# Patient Record
Sex: Female | Born: 1970 | Race: White | Hispanic: No | Marital: Married
Health system: Southern US, Community
[De-identification: ages and names within clinical notes are randomized; demographics above are authoritative.]

## PROBLEM LIST (undated history)

## (undated) HISTORY — PX: AUGMENTATION MAMMAPLASTY: SUR837

## (undated) HISTORY — PX: BUNIONECTOMY: SHX129

---

## 2007-10-01 ENCOUNTER — Inpatient Hospital Stay (HOSPITAL_COMMUNITY): Admission: AD | Admit: 2007-10-01 | Discharge: 2007-10-02 | Payer: Self-pay | Admitting: Obstetrics and Gynecology

## 2007-10-02 ENCOUNTER — Inpatient Hospital Stay (HOSPITAL_COMMUNITY): Admission: AD | Admit: 2007-10-02 | Discharge: 2007-10-02 | Payer: Self-pay | Admitting: Obstetrics and Gynecology

## 2007-10-03 ENCOUNTER — Inpatient Hospital Stay (HOSPITAL_COMMUNITY): Admission: AD | Admit: 2007-10-03 | Discharge: 2007-11-06 | Payer: Self-pay | Admitting: Obstetrics and Gynecology

## 2008-01-01 ENCOUNTER — Ambulatory Visit: Admission: RE | Admit: 2008-01-01 | Discharge: 2008-01-01 | Payer: Self-pay | Admitting: Obstetrics and Gynecology

## 2010-02-21 ENCOUNTER — Inpatient Hospital Stay (HOSPITAL_COMMUNITY)
Admission: AD | Admit: 2010-02-21 | Discharge: 2010-02-23 | Payer: Self-pay | Source: Home / Self Care | Attending: Obstetrics and Gynecology | Admitting: Obstetrics and Gynecology

## 2010-02-21 ENCOUNTER — Encounter (INDEPENDENT_AMBULATORY_CARE_PROVIDER_SITE_OTHER): Payer: Self-pay | Admitting: Obstetrics and Gynecology

## 2010-03-03 ENCOUNTER — Inpatient Hospital Stay: Admission: RE | Admit: 2010-03-03 | Payer: Self-pay | Source: Home / Self Care | Admitting: Obstetrics and Gynecology

## 2010-05-08 LAB — CBC
MCH: 33.2 pg (ref 26.0–34.0)
MCH: 33.4 pg (ref 26.0–34.0)
MCHC: 35.1 g/dL (ref 30.0–36.0)
MCHC: 35.1 g/dL (ref 30.0–36.0)
MCV: 94.6 fL (ref 78.0–100.0)
MCV: 95.1 fL (ref 78.0–100.0)
Platelets: 156 10*3/uL (ref 150–400)
Platelets: 159 10*3/uL (ref 150–400)
RDW: 12.9 % (ref 11.5–15.5)
RDW: 13.1 % (ref 11.5–15.5)
WBC: 8.6 10*3/uL (ref 4.0–10.5)

## 2010-07-11 NOTE — Op Note (Signed)
NAMELUVERTA, KORTE                  ACCOUNT NO.:  [id number]   MEDICAL RECORD NO.:  [id number]          PATIENT TYPE:  INP   LOCATION:  NA                            FACILITY:  [facility]   PHYSICIAN:  Michelle L. Grewal, M.D.DATE OF BIRTH:  Mar 09, 1970   DATE OF PROCEDURE:  11/03/2007  DATE OF DISCHARGE:                               OPERATIVE REPORT   PREOPERATIVE DIAGNOSES:  1. Intrauterine pregnancy at 37 weeks.  2. Double footling breech with funic presentation.   POSTOPERATIVE DIAGNOSES:  1. Intrauterine pregnancy at 37 weeks.  2. Double footling breech with funic presentation.   PROCEDURE:  Primary low-transverse cesarean section.   SURGEON:  Michelle L. Grewal, MD   ANESTHESIA:  Spinal.   FINDINGS:  Female infant in double footling breech position with  significant cord presenting at the cervical os.  Apgars 9 at 1 minute  and 9 at 5 minutes.   ESTIMATED BLOOD LOSS:  500 mL.   DRAINS:  Foley.   COMPLICATIONS:  None.   PROCEDURE:  The patient was taken to the operating room from antenatal.  A spinal was placed and she was prepped and draped in the usual sterile  fashion.  A Foley catheter was inserted.  A low-transverse incision was  made and carried down to the fascia.  Fascia was scored in the midline  and extended laterally.  The rectus muscles were separated in the  midline.  The peritoneum was entered bluntly.  A bladder blade was  inserted.  The lower uterine segment was identified.  The bladder flap  was created sharply and then digitally.  A bladder blade was readjusted.  A low-transverse incision was made in the uterus.  The uterus was  entered using a hemostat.  Amniotic fluid was clear.  The baby was in  double footling breech position and there was significant amount of cord  presenting at the cervical os.  It was wrapped around the feet.  The  baby was delivered without any difficulty at all.  Baby was a female  infant, Apgars 9 at 1 minute and 9 at 5  minutes.  The cord was also  noted to be in itself short.  After the baby was handed to the awaiting  neonatal team, the placenta was manually removed and noted to be normal  and intact with a 3-vessel cord.  The uterus was exteriorized and  cleared of all clots and debris.  The antibiotics and Pitocin had been  given.  The uterine incision was closed in 2 layers using 0 chromic in a  running locked stitch.  The uterus was returned to the abdomen.  Irrigation was performed.  The rectus muscles were reapproximated using  0 Vicryl as well as the peritoneum.  The fascia was closed using 0  Vicryl in a running locked stitch.  After irrigation of subcutaneous  layer  and noting hemostasis, the skin was closed in subcuticular using 3-0  Vicryl in a running stitch.  Dermabond skin adhesive was placed on the  incision.  All sponge, lap, and instrument counts were correct  x2.  The  patient went to recovery room in stable condition.      Michelle L. Vincente Poli, M.D.  Electronically Signed     MLG/MEDQ  D:  11/03/2007  T:  11/03/2007  Job:  [id number]

## 2010-07-11 NOTE — Discharge Summary (Signed)
Joanna Fuller, Joanna Fuller                  ACCOUNT NO.:  [id number]   MEDICAL RECORD NO.:  [id number]          PATIENT TYPE:  MAT   LOCATION:  MATC                          FACILITY:  [facility]   PHYSICIAN:  Juluis Mire, M.D.   DATE OF BIRTH:  21-Feb-1971   DATE OF ADMISSION:  10/02/2007  DATE OF DISCHARGE:  10/02/2007                               DISCHARGE SUMMARY   ADMITTING DIAGNOSES:  Intrauterine pregnancy at 32-4/7 with preterm  cervical change and preterm labor.   DISCHARGE DIAGNOSES:  Intrauterine pregnancy at 32-4/7 with preterm  cervical change and preterm labor.   For complete history and physical, please see written note.   COURSE IN THE HOSPITAL:  The patient was brought in, placed on  Procardia, and continuous monitoring.  With Procardia, uterine activity  resolved.  Fetal heart rate remained reactive.  No decelerations.  Following morning, the cervix was basically unchanged, closed in 50%.  She had received one betamethasone shot and was due a second one on the  evening of October 02, 2007.  She, therefore, will be sent home at this  point in time.   In terms of complications, none were encountered during her stay in the  hospital.  The patient was discharged home in stable condition.   DISPOSITION:  The patient will continue Procardia at home 10 mg every 4  hours and rest.  Preterm labor warning signs were given.  Follow up in  the office tomorrow.  She will come in this afternoon to [facility] to receive  her next betamethasone shot.      Juluis Mire, M.D.  Electronically Signed     JSM/MEDQ  D:  10/02/2007  T:  10/02/2007  Job:  [id number]

## 2010-07-11 NOTE — H&P (Signed)
NAMEPETRONELLA, Joanna Fuller                  ACCOUNT NO.:  [id number]   MEDICAL RECORD NO.:  [id number]          PATIENT TYPE:  INP   LOCATION:                                [facility]ACILITY:  [facility]   PHYSICIAN:  Duke Salvia. Marcelle Overlie, M.D.DATE OF BIRTH:  [birth year]   DATE OF ADMISSION:  10/03/2007  DATE OF DISCHARGE:                              HISTORY & PHYSICAL   CHIEF COMPLAINT:  Premature labor.   HISTORY OF PRESENT ILLNESS:  A 40 year old, G1, P0 at 32-5/7 weeks.  One  week ago was hospitalized for evaluation of a short cervix, may receive  IM betamethasone and was discharged on p.o. Procardia.  Two days ago in  our office, cervical length was 5 x 13 mm, noted to be dynamic.  She was  brought back today for follow-up showing cervical length 9 mm with  funneling seen, also a cord was presenting breech presentation.  Clinically, the cervix was closed.  FFN dated August 6 was negative.  Due to these changes, she is admitted now for bedrest and observation.   One-hour GTT was 100.  Prenatal course has been otherwise uneventful.   PAST MEDICAL HISTORY:  Please see the [facility] form for details.   PHYSICAL EXAMINATION:  Temperature 98.2, blood pressure 120/70.  HEENT:  Unremarkable.  NECK:  Supple without masses.  LUNGS:  Clear.  CARDIOVASCULAR:  Rate and rhythm without murmurs, rubs, gallops.  BREASTS:  Not examined.  Fetal heart rate was 140, 30 cm fundal height.  Cervix was closed and  thin.  EXTREMITIES/NEUROLOGIC EXAM:  Unremarkable.   IMPRESSION:  1. A 32-5/[redacted] week gestation.  2. Breech presentation.  3. Preterm labor with cervix 9 mm, funneling noted with cord      presenting.   PLAN:  Will admit for bedrest, observation.  Continue Procardia and  follow-up ultrasound.      Richard M. Marcelle Overlie, M.D.  Electronically Signed     RMH/MEDQ  D:  10/03/2007  T:  10/03/2007  Job:  [id number]

## 2010-07-14 NOTE — Discharge Summary (Signed)
Joanna Fuller, Joanna Fuller                  ACCOUNT NO.:  [id number]   MEDICAL RECORD NO.:  [id number]          PATIENT TYPE:  INP   LOCATION:  9121                          FACILITY:  [facility]   PHYSICIAN:  Freddy Finner, M.D.   DATE OF BIRTH:  1970-12-17   DATE OF ADMISSION:  10/03/2007  DATE OF DISCHARGE:  11/06/2007                               DISCHARGE SUMMARY   ADMITTING DIAGNOSES:  1. Intrauterine pregnancy at 32-5/7 weeks' estimated gestational age.  2. Breech presentation.  3. Preterm labor.   DISCHARGE DIAGNOSES:  1. Status post low transverse cesarean section secondary to breech,      funic presentation.  2. Viable female infant.   PROCEDURE:  Primary low transverse cesarean section.   REASON FOR ADMISSION:  Please see dictated H and P.   HOSPITAL COURSE:  The patient is a 40 year old primigravida that was  admitted to [facility] at 32-5/7 weeks' estimated  gestational age for preterm labor with breech presentation with cord  presenting.  The patient had been hospitalized approximately a week  prior to her current admission with noted shortened cervix.  The patient  had received IM betamethasone at that time and was discharged home on  Procardia.  The patient did present to the office and cervical length  was now noted to be 5 x 13 mm and noted to be somewhat dynamic.  Because  of the cord presenting with a breech presentation and cervix was found  to be shortened, a decision was made to hospitalize the patient for  bedrest and observation.  She was continued on Procardia.  On admission,  vital signs were stable.  Fetal heart tones were in the 140s.  The  patient was placed on the monitor and rare contraction was seen.  Vital  signs were stable.  The patient was observed over the next several days,  continued to do well on bedrest and thought was that we would perform  amniocentesis at approximately 35 weeks for fetal lung maturity.  The  patient did do well  and at approximately 35 weeks, amniocentesis was  performed without difficulty for LS and PG.  The patient tolerated the  procedure well; however, the amniocentesis came back immature.  The  patient continued on bedrest and vital signs remained stable.  Fetal  heart tones were reactive and decision was made to deliver the patient  at approximately 37 weeks.  On the morning of admission, the patient was  noted to have some elevation in blood pressure.  She was transferred to  the operating room where a spinal anesthesia was administered without  difficulty.  A low transverse incision was made with delivery of a  viable female infant weighing 5 pounds 4 ounces with Apgars of 9 at 1  minute and 9 at 5 minutes.  The patient tolerated the procedure well and  was taken to the recovery room in stable condition.  On postoperative  day #1, the patient denied headache or visual changes.  Blood pressure  was 122/91.  Lungs were clear to auscultation.  Abdomen, soft  with good  return of bowel function.  Incision was clean, dry and intact.  Laboratory findings showed hemoglobin of 12.7, platelet count of  131,000, WBC count of 9.7, SGOT was 52 and SGPT was 28.  The patient  continued to be observed closely.  On postoperative day #2, the patient  denied headache or right upper quadrant pain.  She did complain of some  blurred vision associated with reading.  Vital signs were stable.  She  was afebrile.  Blood pressure 121/88 to 107/72.  Abdomen soft, no right  upper quadrant tenderness was observed.  Fundus was firm, nontender.  Incision was clean, dry and intact with subcuticular closure.  Laboratory findings revealed hemoglobin of 12.9, platelet count up to  155,000, WBC count of 9.5, AST was down to 44, ALT down to 27, alkaline  phosphatase was down to 150.  Uric acid was 4.5.  On postoperative day  #3, the patient was without complaint.  She denied headache, blurred  vision or right upper quadrant  pain.  Vital signs were stable.  Fundus  firm and nontender.  Incision was clean, dry and intact.  Discharge  instructions were reviewed and the patient was later discharged home.   CONDITION ON DISCHARGE:  Stable.   DIET:  Regular as tolerated.   ACTIVITY:  No heavy lifting.  No driving x2 weeks.  No vaginal entry.   FOLLOW UP:  The patient to follow up in the office in 1 week for an  incision check.  She is to call for temperature greater than 100  degrees, persistent nausea, vomiting, heavy vaginal bleeding and/or  redness or drainage from the incisional site.  The patient was also  instructed call for headache, blurred vision or right upper quadrant  pain   DISCHARGE MEDICATIONS:  1. Percocet 5/325 #30 one p.o. every 4-6 hours p.r.n.  2. Motrin 600 mg every 6 hours.  3. Prenatal vitamins 1 p.o. daily.      Julio Sicks, N.P.      Freddy Finner, M.D.  Electronically Signed    CC/MEDQ  D:  12/14/2007  T:  12/14/2007  Job:  [id number]

## 2010-11-24 LAB — URINALYSIS, ROUTINE W REFLEX MICROSCOPIC
Bilirubin Urine: NEGATIVE
Ketones, ur: NEGATIVE
Nitrite: NEGATIVE
Protein, ur: NEGATIVE

## 2010-11-29 LAB — CBC
HCT: 36.6
HCT: 37.5
HCT: 38.4
Hemoglobin: 12.7
Hemoglobin: 13.2
MCHC: 34.4
MCHC: 34.5
MCHC: 34.9
MCV: 97
Platelets: 155
RBC: 3.76 — ABNORMAL LOW
RBC: 3.77 — ABNORMAL LOW
RBC: 3.91
RDW: 12.3
RDW: 12.6
WBC: 10.9 — ABNORMAL HIGH
WBC: 9.5
WBC: 9.7

## 2010-11-29 LAB — DIFFERENTIAL
Basophils Absolute: 0
Basophils Absolute: 0
Basophils Relative: 0
Basophils Relative: 0
Eosinophils Absolute: 0
Eosinophils Relative: 0
Eosinophils Relative: 0
Eosinophils Relative: 1
Lymphocytes Relative: 11 — ABNORMAL LOW
Lymphocytes Relative: 22
Lymphs Abs: 1.1
Lymphs Abs: 1.2
Monocytes Absolute: 0.4
Monocytes Relative: 3
Monocytes Relative: 6
Neutro Abs: 5
Neutro Abs: 9.3 — ABNORMAL HIGH
Neutrophils Relative %: 85 — ABNORMAL HIGH

## 2010-11-29 LAB — COMPREHENSIVE METABOLIC PANEL
ALT: 27
ALT: 28
AST: 44 — ABNORMAL HIGH
AST: 52 — ABNORMAL HIGH
Albumin: 2.4 — ABNORMAL LOW
Alkaline Phosphatase: 150 — ABNORMAL HIGH
Alkaline Phosphatase: 156 — ABNORMAL HIGH
BUN: 6
BUN: 8
CO2: 25
CO2: 25
Calcium: 8.2 — ABNORMAL LOW
Calcium: 8.3 — ABNORMAL LOW
Chloride: 102
Chloride: 104
Creatinine, Ser: 0.55
GFR calc Af Amer: >60
GFR calc Af Amer: >60
GFR calc Af Amer: >60
GFR calc non Af Amer: >60
GFR calc non Af Amer: >60
Potassium: 3.7
Potassium: 3.9
Sodium: 134 — ABNORMAL LOW
Sodium: 136
Total Bilirubin: 0.4
Total Bilirubin: 0.5
Total Protein: 5.7 — ABNORMAL LOW

## 2010-11-29 LAB — LACTATE DEHYDROGENASE
LDH: 316 — ABNORMAL HIGH
LDH: 321 — ABNORMAL HIGH

## 2010-11-29 LAB — URIC ACID
Uric Acid, Serum: 4.5
Uric Acid, Serum: 4.6

## 2010-11-29 LAB — RPR: RPR Ser Ql: NONREACTIVE

## 2011-04-19 ENCOUNTER — Other Ambulatory Visit: Payer: Self-pay | Admitting: Obstetrics and Gynecology

## 2012-04-21 ENCOUNTER — Other Ambulatory Visit: Payer: Self-pay | Admitting: Obstetrics and Gynecology

## 2013-06-24 ENCOUNTER — Other Ambulatory Visit: Payer: Self-pay | Admitting: Obstetrics and Gynecology

## 2014-07-07 ENCOUNTER — Other Ambulatory Visit: Payer: Self-pay | Admitting: Obstetrics and Gynecology

## 2014-07-08 LAB — CYTOLOGY - PAP

## 2017-11-11 ENCOUNTER — Other Ambulatory Visit: Payer: Self-pay | Admitting: Obstetrics and Gynecology

## 2017-11-11 DIAGNOSIS — R928 Other abnormal and inconclusive findings on diagnostic imaging of breast: Secondary | ICD-10-CM

## 2017-11-15 ENCOUNTER — Ambulatory Visit
Admission: RE | Admit: 2017-11-15 | Discharge: 2017-11-15 | Disposition: A | Discharge status: Home / Self Care | Payer: BLUE CROSS/BLUE SHIELD | Source: Ambulatory Visit | Attending: Obstetrics and Gynecology | Admitting: Obstetrics and Gynecology

## 2017-11-15 ENCOUNTER — Other Ambulatory Visit: Payer: Self-pay | Admitting: Obstetrics and Gynecology

## 2017-11-15 DIAGNOSIS — N6489 Other specified disorders of breast: Secondary | ICD-10-CM

## 2017-11-15 DIAGNOSIS — R928 Other abnormal and inconclusive findings on diagnostic imaging of breast: Secondary | ICD-10-CM

## 2018-12-30 ENCOUNTER — Other Ambulatory Visit: Payer: Self-pay

## 2018-12-30 DIAGNOSIS — Z20822 Contact with and (suspected) exposure to covid-19: Secondary | ICD-10-CM

## 2018-12-31 LAB — NOVEL CORONAVIRUS, NAA: SARS-CoV-2, NAA: NOT DETECTED

## 2019-04-13 ENCOUNTER — Other Ambulatory Visit: Payer: Self-pay | Admitting: Obstetrics and Gynecology

## 2019-04-13 DIAGNOSIS — R928 Other abnormal and inconclusive findings on diagnostic imaging of breast: Secondary | ICD-10-CM

## 2019-08-03 ENCOUNTER — Ambulatory Visit: Payer: BLUE CROSS/BLUE SHIELD | Admitting: Family Medicine

## 2019-10-20 ENCOUNTER — Ambulatory Visit: Payer: BLUE CROSS/BLUE SHIELD | Admitting: Family Medicine

## 2019-10-21 ENCOUNTER — Ambulatory Visit: Payer: BLUE CROSS/BLUE SHIELD | Admitting: Family Medicine

## 2019-10-24 ENCOUNTER — Telehealth: Payer: Self-pay | Admitting: Unknown Physician Specialty

## 2019-10-24 NOTE — Telephone Encounter (Signed)
Called to discuss with Sharion Settler about Covid symptoms and the use of bamlanivimab, a monoclonal antibody infusion for those with mild to moderate Covid symptoms and at a high risk of hospitalization.      Pt does not qualify for infusion therapy as she has no comorbid conditions

## 2020-01-15 ENCOUNTER — Other Ambulatory Visit: Payer: Self-pay

## 2020-01-15 ENCOUNTER — Ambulatory Visit: Payer: BLUE CROSS/BLUE SHIELD | Admitting: Family Medicine

## 2020-01-15 ENCOUNTER — Encounter: Payer: Self-pay | Admitting: Family Medicine

## 2020-01-15 VITALS — BP 121/70 | HR 57 | Ht 66.75 in | Wt 120.0 lb

## 2020-01-15 DIAGNOSIS — Z0000 Encounter for general adult medical examination without abnormal findings: Secondary | ICD-10-CM | POA: Diagnosis not present

## 2020-01-15 DIAGNOSIS — Z83438 Family history of other disorder of lipoprotein metabolism and other lipidemia: Secondary | ICD-10-CM | POA: Diagnosis not present

## 2020-01-15 DIAGNOSIS — L659 Nonscarring hair loss, unspecified: Secondary | ICD-10-CM

## 2020-01-15 DIAGNOSIS — E559 Vitamin D deficiency, unspecified: Secondary | ICD-10-CM

## 2020-01-15 NOTE — Progress Notes (Signed)
Office Visit Note   Patient: Joanna Fuller           Date of Birth: 01-21-1971           MRN: [id number] Visit Date: 01/15/2020 Requested by: No referring provider defined for this encounter. PCP: No primary care provider on file.  Subjective: Chief Complaint  Patient presents with  . establish primary care    HPI: She is here to reestablish care.  She has not had a wellness exam in 6 or 7 years.  Overall she has been in good health.  She had COVID-19 infection in August and recovered well from it.  She has no chronic medical problems.  Her only concern today is thinning of her hair and occasional fatigue.  She has a family history of hyperlipidemia and dementia in several family members.  Family history was updated today.  She sees a gynecologist periodically for well woman checks and mammograms.  She has not yet had a colonoscopy and would prefer Cologuard if possible.                ROS: She has a history of irritable bowel syndrome which resolved.  All other systems were reviewed and are negative.  Objective: Vital Signs: BP 121/70   Pulse (!) 57   Ht 5' 6.75" (1.695 m)   Wt 120 lb (54.4 kg)   BMI 18.94 kg/m   Physical Exam:  General:  Alert and oriented, in no acute distress. Pulm:  Breathing unlabored. Psy:  Normal mood, congruent affect. Skin: No rash, no nail deformities. HEENT:  /AT, PERRLA, EOM Full, no nystagmus.  Funduscopic examination within normal limits.  No conjunctival erythema.  Tympanic membranes are pearly gray with normal landmarks.  External ear canals are normal.  Nasal passages are clear.  Oropharynx is clear.  No significant lymphadenopathy.  No thyromegaly or nodules.  2+ carotid pulses without bruits. CV: Regular rate and rhythm without murmurs, rubs, or gallops.  No peripheral edema.  2+ radial and posterior tibial pulses. Lungs: Clear to auscultation throughout with no wheezing or areas of consolidation. Abdomen: Bowel sounds are  active. Extremities: 2+ upper and lower DTRs.   Imaging: No results found.  Assessment & Plan: 1.  Wellness examination - Labs today, Cologuard ordered.  Follow-up yearly.  2.  Hair thinning -We will check thyroid function and ANA     Procedures: No procedures performed  No notes on file     PMFS History: There are no problems to display for this patient.  History reviewed. No pertinent past medical history.  Family History  Problem Relation Age of Onset  . Hypertension Mother   . Hyperlipidemia Mother   . Hyperlipidemia Father   . Dementia Father   . Healthy Brother   . Diabetes Maternal Grandmother   . Heart disease Maternal Grandfather   . Heart attack Maternal Grandfather   . Alzheimer's disease Paternal Grandmother   . Parkinson's disease Paternal Grandfather   . Breast cancer Neg Hx   . Cancer Neg Hx     Past Surgical History:  Procedure Laterality Date  . AUGMENTATION MAMMAPLASTY Bilateral   . BUNIONECTOMY Left   . CESAREAN SECTION     Social History   Occupational History  . Not on file  Tobacco Use  . Smoking status: Not on file  Substance and Sexual Activity  . Alcohol use: Not on file  . Drug use: Not on file  . Sexual activity: Not on  file

## 2020-01-18 ENCOUNTER — Telehealth: Payer: Self-pay | Admitting: Family Medicine

## 2020-01-18 LAB — CBC WITH DIFFERENTIAL/PLATELET
Absolute Monocytes: 460 cells/uL (ref 200–950)
Basophils Absolute: 12 cells/uL (ref 0–200)
Basophils Relative: 0.2 %
Eosinophils Absolute: 100 cells/uL (ref 15–500)
Eosinophils Relative: 1.7 %
HCT: 39.5 % (ref 35.0–45.0)
Hemoglobin: 13.3 g/dL (ref 11.7–15.5)
Lymphs Abs: 1404 cells/uL (ref 850–3900)
MCH: 31.1 pg (ref 27.0–33.0)
MCHC: 33.7 g/dL (ref 32.0–36.0)
MCV: 92.3 fL (ref 80.0–100.0)
MPV: 12.1 fL (ref 7.5–12.5)
Monocytes Relative: 7.8 %
Neutro Abs: 3924 cells/uL (ref 1500–7800)
Neutrophils Relative %: 66.5 %
Platelets: 228 10*3/uL (ref 140–400)
RBC: 4.28 10*6/uL (ref 3.80–5.10)
RDW: 12.7 % (ref 11.0–15.0)
Total Lymphocyte: 23.8 %
WBC: 5.9 10*3/uL (ref 3.8–10.8)

## 2020-01-18 LAB — ANA: Anti Nuclear Antibody (ANA): NEGATIVE

## 2020-01-18 LAB — COMPREHENSIVE METABOLIC PANEL
AG Ratio: 1.6 (calc) (ref 1.0–2.5)
ALT: 17 U/L (ref 6–29)
AST: 25 U/L (ref 10–35)
Albumin: 4.4 g/dL (ref 3.6–5.1)
Alkaline phosphatase (APISO): 65 U/L (ref 37–153)
BUN: 13 mg/dL (ref 7–25)
CO2: 25 mmol/L (ref 20–32)
Calcium: 9.3 mg/dL (ref 8.6–10.4)
Chloride: 102 mmol/L (ref 98–110)
Creat: 0.63 mg/dL (ref 0.50–1.05)
Globulin: 2.7 g/dL (calc) (ref 1.9–3.7)
Glucose, Bld: 115 mg/dL — ABNORMAL HIGH (ref 65–99)
Potassium: 4 mmol/L (ref 3.5–5.3)
Sodium: 138 mmol/L (ref 135–146)
Total Bilirubin: 0.6 mg/dL (ref 0.2–1.2)
Total Protein: 7.1 g/dL (ref 6.1–8.1)

## 2020-01-18 LAB — LIPID PANEL
Cholesterol: 193 mg/dL (ref <200)
HDL: 71 mg/dL (ref >=50)
LDL Cholesterol (Calc): 99 mg/dL (calc)
Non-HDL Cholesterol (Calc): 122 mg/dL (calc) (ref <130)
Total CHOL/HDL Ratio: 2.7 (calc) (ref <5.0)
Triglycerides: 124 mg/dL (ref <150)

## 2020-01-18 LAB — VITAMIN D 25 HYDROXY (VIT D DEFICIENCY, FRACTURES): Vit D, 25-Hydroxy: 48 ng/mL (ref 30–100)

## 2020-01-18 LAB — THYROID PANEL WITH TSH
Free Thyroxine Index: 1.8 (ref 1.4–3.8)
T3 Uptake: 32 % (ref 22–35)
T4, Total: 5.6 ug/dL (ref 5.1–11.9)
TSH: 2.42 mIU/L

## 2020-01-18 LAB — HIGH SENSITIVITY CRP: hs-CRP: <0.3 mg/L

## 2020-01-18 NOTE — Telephone Encounter (Signed)
All labs look good except that glucose is elevated.

## 2020-03-24 ENCOUNTER — Encounter: Payer: Self-pay | Admitting: Family Medicine

## 2022-04-16 ENCOUNTER — Encounter: Payer: Self-pay | Admitting: Obstetrics and Gynecology

## 2023-07-02 ENCOUNTER — Other Ambulatory Visit: Payer: Self-pay | Admitting: Obstetrics and Gynecology

## 2023-07-02 DIAGNOSIS — Z1231 Encounter for screening mammogram for malignant neoplasm of breast: Secondary | ICD-10-CM

## 2023-07-10 ENCOUNTER — Ambulatory Visit

## 2023-07-17 ENCOUNTER — Other Ambulatory Visit: Payer: Self-pay | Admitting: Obstetrics and Gynecology

## 2023-07-17 ENCOUNTER — Ambulatory Visit
Admission: RE | Admit: 2023-07-17 | Discharge: 2023-07-17 | Disposition: A | Discharge status: Home / Self Care | Source: Ambulatory Visit | Attending: Obstetrics and Gynecology | Admitting: Obstetrics and Gynecology

## 2023-07-17 DIAGNOSIS — Z1231 Encounter for screening mammogram for malignant neoplasm of breast: Secondary | ICD-10-CM
